# Patient Record
Sex: Female | Born: 1950 | Race: White | Hispanic: No | Marital: Married | Smoking: Never smoker
Health system: Southern US, Community
[De-identification: ages and names within clinical notes are randomized; demographics above are authoritative.]

---

## 2014-06-23 ENCOUNTER — Encounter (HOSPITAL_COMMUNITY): Payer: Self-pay | Admitting: Emergency Medicine

## 2014-06-23 ENCOUNTER — Emergency Department (HOSPITAL_COMMUNITY)
Admission: EM | Admit: 2014-06-23 | Discharge: 2014-06-23 | Disposition: A | Discharge status: Home / Self Care | Payer: BC Managed Care – PPO | Attending: Emergency Medicine | Admitting: Emergency Medicine

## 2014-06-23 DIAGNOSIS — S199XXA Unspecified injury of neck, initial encounter: Secondary | ICD-10-CM

## 2014-06-23 DIAGNOSIS — Y9389 Activity, other specified: Secondary | ICD-10-CM | POA: Insufficient documentation

## 2014-06-23 DIAGNOSIS — Z792 Long term (current) use of antibiotics: Secondary | ICD-10-CM | POA: Insufficient documentation

## 2014-06-23 DIAGNOSIS — Y9289 Other specified places as the place of occurrence of the external cause: Secondary | ICD-10-CM | POA: Diagnosis not present

## 2014-06-23 DIAGNOSIS — S79919A Unspecified injury of unspecified hip, initial encounter: Secondary | ICD-10-CM | POA: Diagnosis not present

## 2014-06-23 DIAGNOSIS — S01502A Unspecified open wound of oral cavity, initial encounter: Secondary | ICD-10-CM | POA: Diagnosis not present

## 2014-06-23 DIAGNOSIS — Z79899 Other long term (current) drug therapy: Secondary | ICD-10-CM | POA: Diagnosis not present

## 2014-06-23 DIAGNOSIS — W1809XA Striking against other object with subsequent fall, initial encounter: Secondary | ICD-10-CM | POA: Insufficient documentation

## 2014-06-23 DIAGNOSIS — S0181XA Laceration without foreign body of other part of head, initial encounter: Secondary | ICD-10-CM

## 2014-06-23 DIAGNOSIS — S0993XA Unspecified injury of face, initial encounter: Secondary | ICD-10-CM | POA: Insufficient documentation

## 2014-06-23 DIAGNOSIS — S0180XA Unspecified open wound of other part of head, initial encounter: Secondary | ICD-10-CM | POA: Diagnosis not present

## 2014-06-23 DIAGNOSIS — S79929A Unspecified injury of unspecified thigh, initial encounter: Secondary | ICD-10-CM

## 2014-06-23 DIAGNOSIS — S01512A Laceration without foreign body of oral cavity, initial encounter: Secondary | ICD-10-CM

## 2014-06-23 MED ORDER — PENICILLIN V POTASSIUM 500 MG PO TABS: 500.0000 mg | ORAL_TABLET | Freq: Four times a day (QID) | ORAL | Status: DC

## 2014-06-23 MED ORDER — HYDROCODONE-ACETAMINOPHEN 5-325 MG PO TABS: 2.0000 | ORAL_TABLET | ORAL | Status: DC | PRN

## 2014-06-23 NOTE — ED Provider Notes (Addendum)
CSN: [id number]     Arrival date & time 06/23/14  1014 History   First MD Initiated Contact with Patient 06/23/14 1104     Chief Complaint  Patient presents with  . Fall  . Facial Laceration  . Mouth Injury  . Hip Pain     HPI  Is presents after a fall in the shower. She fell forward and struck her face against the edge of the shower. She also struck her left leg in the mid thigh. His laceration in her mouth. Has laceration above her right eye. No loss of consciousness. States she slipped. Was not symptomatically for her fall. No loss of consciousness or amnesia. Was spitting some blood for a short time and states that her bottom front teeth feel numb. No confusion or perseveration. Pain in her left leg but she is ambulatory. Is not anticoagulated. Does not take medications.  History reviewed. No pertinent past medical history. History reviewed. No pertinent past surgical history. History reviewed. No pertinent family history. History  Substance Use Topics  . Smoking status: Never Smoker   . Smokeless tobacco: Not on file  . Alcohol Use: No   OB History   Grav Para Term Preterm Abortions TAB SAB Ect Mult Living                 Review of Systems  Constitutional: Negative for fever, chills, diaphoresis, appetite change and fatigue.  HENT: Positive for dental problem. Negative for mouth sores, sore throat and trouble swallowing.        Laceration in the mouth. Also above the right eye.  Eyes: Negative for visual disturbance.  Respiratory: Negative for cough, chest tightness, shortness of breath and wheezing.   Cardiovascular: Negative for chest pain.  Gastrointestinal: Negative for nausea, vomiting, abdominal pain, diarrhea and abdominal distention.  Endocrine: Negative for polydipsia, polyphagia and polyuria.  Genitourinary: Negative for dysuria, frequency and hematuria.  Musculoskeletal: Negative for gait problem.  Skin: Positive for wound. Negative for color change, pallor and  rash.  Neurological: Negative for dizziness, syncope, light-headedness and headaches.  Hematological: Does not bruise/bleed easily.  Psychiatric/Behavioral: Negative for behavioral problems and confusion.      Allergies  Review of patient's allergies indicates not on file.  Home Medications   Prior to Admission medications   Medication Sig Start Date End Date Taking? Authorizing Provider  HYDROcodone-acetaminophen (NORCO/VICODIN) 5-325 MG per tablet Take 2 tablets by mouth every 4 (four) hours as needed. 06/23/14   Rolland PorterMark Wayne Brunker, MD  penicillin v potassium (VEETID) 500 MG tablet Take 1 tablet (500 mg total) by mouth 4 (four) times daily. 06/23/14   Rolland PorterMark Kennedy Brines, MD   BP 112/73  Pulse 71  Temp(Src) 98.2 F (36.8 C) (Oral)  Resp 20  SpO2 98% Physical Exam  Constitutional: She is oriented to person, place, and time. She appears well-developed and well-nourished. No distress.  HENT:  Head: Normocephalic.    Mouth/Throat:    No blood over the TMs mastoids or from ears nose or mouth.  Eyes: Conjunctivae are normal. Pupils are equal, round, and reactive to light. No scleral icterus.  Neck: Normal range of motion. Neck supple. No thyromegaly present.  Cardiovascular: Normal rate and regular rhythm.  Exam reveals no gallop and no friction rub.   No murmur heard. Pulmonary/Chest: Effort normal and breath sounds normal. No respiratory distress. She has no wheezes. She has no rales.    Abdominal: Soft. Bowel sounds are normal. She exhibits no distension. There is no  tenderness. There is no rebound.  Musculoskeletal: Normal range of motion.       Legs: Neurological: She is alert and oriented to person, place, and time.  Skin: Skin is warm and dry. No rash noted.  Psychiatric: She has a normal mood and affect. Her behavior is normal.    ED Course  Procedures (including critical care time) Labs Review Labs Reviewed - No data to display  Imaging Review No results found.   EKG  Interpretation None      MDM   Final diagnoses:  Facial laceration, initial encounter  Laceration of mouth, initial encounter    Laceration of the right eyebrow repaired. Laceration between the gingiva and buccal mucosa anterior to the bottom teeth is not repaired. She will swish and spit after by mouth. Penicillinas  wound prophylaxis. No bite diet for the next several days.    Rolland Porter, MD 06/23/14 1203  Rolland Porter, MD 06/23/14 1205  Rolland Porter, MD 07/08/14 [phone]

## 2014-06-23 NOTE — ED Notes (Signed)
Pt. Stated, i slipped and fell in the bath tub and fell forward. I cut my head , mouth .  I fell forward and bruised my stomach and my left hip, leg area hurts.

## 2014-06-23 NOTE — Discharge Instructions (Signed)
Sutures should be removed between 8-7 and 8-12. Swish and spit water after meals. Numbness to the teeth can be temporary or permanent. Also, can take months to resolve.  Facial Laceration  A facial laceration is a cut on the face. These injuries can be painful and cause bleeding. Lacerations usually heal quickly, but they need special care to reduce scarring. DIAGNOSIS  Your health care provider will take a medical history, ask for details about how the injury occurred, and examine the wound to determine how deep the cut is. TREATMENT  Some facial lacerations may not require closure. Others may not be able to be closed because of an increased risk of infection. The risk of infection and the chance for successful closure will depend on various factors, including the amount of time since the injury occurred. The wound may be cleaned to help prevent infection. If closure is appropriate, pain medicines may be given if needed. Your health care provider will use stitches (sutures), wound glue (adhesive), or skin adhesive strips to repair the laceration. These tools bring the skin edges together to allow for faster healing and a better cosmetic outcome. If needed, you may also be given a tetanus shot. HOME CARE INSTRUCTIONS  Only take over-the-counter or prescription medicines as directed by your health care provider.  Follow your health care provider's instructions for wound care. These instructions will vary depending on the technique used for closing the wound. For Sutures:  Keep the wound clean and dry.   If you were given a bandage (dressing), you should change it at least once a day. Also change the dressing if it becomes wet or dirty, or as directed by your health care provider.   Wash the wound with soap and water 2 times a day. Rinse the wound off with water to remove all soap. Pat the wound dry with a clean towel.   After cleaning, apply a thin layer of the antibiotic ointment  recommended by your health care provider. This will help prevent infection and keep the dressing from sticking.   You may shower as usual after the first 24 hours. Do not soak the wound in water until the sutures are removed.   Get your sutures removed as directed by your health care provider. With facial lacerations, sutures should usually be taken out after 4-5 days to avoid stitch marks.   Wait a few days after your sutures are removed before applying any makeup. For Skin Adhesive Strips:  Keep the wound clean and dry.   Do not get the skin adhesive strips wet. You may bathe carefully, using caution to keep the wound dry.   If the wound gets wet, pat it dry with a clean towel.   Skin adhesive strips will fall off on their own. You may trim the strips as the wound heals. Do not remove skin adhesive strips that are still stuck to the wound. They will fall off in time.  For Wound Adhesive:  You may briefly wet your wound in the shower or bath. Do not soak or scrub the wound. Do not swim. Avoid periods of heavy sweating until the skin adhesive has fallen off on its own. After showering or bathing, gently pat the wound dry with a clean towel.   Do not apply liquid medicine, cream medicine, ointment medicine, or makeup to your wound while the skin adhesive is in place. This may loosen the film before your wound is healed.   If a dressing is placed over  the wound, be careful not to apply tape directly over the skin adhesive. This may cause the adhesive to be pulled off before the wound is healed.   Avoid prolonged exposure to sunlight or tanning lamps while the skin adhesive is in place.  The skin adhesive will usually remain in place for 5-10 days, then naturally fall off the skin. Do not pick at the adhesive film.  After Healing: Once the wound has healed, cover the wound with sunscreen during the day for 1 full year. This can help minimize scarring. Exposure to ultraviolet light  in the first year will darken the scar. It can take 1-2 years for the scar to lose its redness and to heal completely.  SEEK IMMEDIATE MEDICAL CARE IF:  You have redness, pain, or swelling around the wound.   You see ayellowish-white fluid (pus) coming from the wound.   You have chills or a fever.  MAKE SURE YOU:  Understand these instructions.  Will watch your condition.  Will get help right away if you are not doing well or get worse. Document Released: 12/16/2004 Document Revised: 08/29/2013 Document Reviewed: 06/21/2013 [facility] Patient Information 2015 [place], [place]. This information is not intended to replace advice given to you by your health care provider. Make sure you discuss any questions you have with your health care provider.

## 2019-06-19 ENCOUNTER — Ambulatory Visit: Payer: Managed Care, Other (non HMO)

## 2019-06-19 ENCOUNTER — Ambulatory Visit
Admission: EM | Admit: 2019-06-19 | Discharge: 2019-06-19 | Disposition: A | Discharge status: Home / Self Care | Payer: Managed Care, Other (non HMO) | Attending: Emergency Medicine | Admitting: Emergency Medicine

## 2019-06-19 ENCOUNTER — Other Ambulatory Visit: Payer: Self-pay

## 2019-06-19 ENCOUNTER — Ambulatory Visit (INDEPENDENT_AMBULATORY_CARE_PROVIDER_SITE_OTHER): Payer: Managed Care, Other (non HMO)

## 2019-06-19 ENCOUNTER — Encounter: Payer: Self-pay | Admitting: Emergency Medicine

## 2019-06-19 DIAGNOSIS — S92152A Displaced avulsion fracture (chip fracture) of left talus, initial encounter for closed fracture: Secondary | ICD-10-CM

## 2019-06-19 DIAGNOSIS — S93102A Unspecified subluxation of left toe(s), initial encounter: Secondary | ICD-10-CM | POA: Diagnosis not present

## 2019-06-19 DIAGNOSIS — W010XXA Fall on same level from slipping, tripping and stumbling without subsequent striking against object, initial encounter: Secondary | ICD-10-CM | POA: Diagnosis not present

## 2019-06-19 MED ORDER — NAPROXEN 500 MG PO TABS: 500.0000 mg | ORAL_TABLET | Freq: Two times a day (BID) | ORAL | 0 refills | Status: AC

## 2019-06-19 NOTE — ED Provider Notes (Addendum)
[name]    CSN: [id number] Arrival date & time: 06/19/19  1014     History   Chief Complaint Chief Complaint  Patient presents with   Foot Pain    HPI Stacey Chan is a 68 y.o. female presenting for left second toe pain and swelling.  Patient states that she slipped and fell on a wet floor at a fast food restaurant last night around 17:00.  Denies head trauma, loss of consciousness.  No other pain reported.  Patient denies easy bruising, bleeding, anticoagulation or blood thinner use.    History reviewed. No pertinent past medical history.  There are no active problems to display for this patient.   History reviewed. No pertinent surgical history.  OB History   No obstetric history on file.      Home Medications    Prior to Admission medications   Medication Sig Start Date End Date Taking? Authorizing Provider  naproxen (NAPROSYN) 500 MG tablet Take 1 tablet (500 mg total) by mouth 2 (two) times daily. 06/19/19   Hall-Potvin, [place], PA-C    Family History History reviewed. No pertinent family history.  Social History Social History   Tobacco Use   Smoking status: Never Smoker   Smokeless tobacco: Never Used  Substance Use Topics   Alcohol use: No   Drug use: No     Allergies   Patient has no known allergies.   Review of Systems Review of Systems  Constitutional: Negative for fatigue and fever.  HENT: Negative for ear pain, sinus pain, sore throat and voice change.   Eyes: Negative for pain, redness and visual disturbance.  Respiratory: Negative for cough and shortness of breath.   Cardiovascular: Negative for chest pain and palpitations.  Gastrointestinal: Negative for abdominal pain, diarrhea and vomiting.  Musculoskeletal: Negative for arthralgias and myalgias.       Positive toe pain and deformity  Skin: Negative for rash and wound.  Neurological: Negative for syncope and headaches.     Physical Exam Triage Vital  Signs ED Triage Vitals  Enc Vitals Group     BP      Pulse      Resp      Temp      Temp src      SpO2      Weight      Height      Head Circumference      Peak Flow      Pain Score      Pain Loc      Pain Edu?      Excl. in [place]?    No data found.  Updated Vital Signs BP 137/74 (BP Location: Right Arm)    Pulse 74    Temp 98.1 F (36.7 C) (Oral)    Resp 16    SpO2 98%   Visual Acuity Right Eye Distance:   Left Eye Distance:   Bilateral Distance:    Right Eye Near:   Left Eye Near:    Bilateral Near:     Physical Exam Constitutional:      General: She is not in acute distress. HENT:     Head: Normocephalic and atraumatic.  Eyes:     General: No scleral icterus.    Pupils: Pupils are equal, round, and reactive to light.  Cardiovascular:     Rate and Rhythm: Normal rate.  Pulmonary:     Effort: Pulmonary effort is normal.  Musculoskeletal:     Comments: Bony  deformity of distal second toe that is tender and mildly swollen.  Decreased range of motion second pain.  NVI.  Patient is able to ambulate, though is antalgic, favoring affected foot  Skin:    Capillary Refill: Capillary refill takes less than 2 seconds.     Coloration: Skin is not jaundiced or pale.  Neurological:     General: No focal deficit present.     Mental Status: She is alert and oriented to person, place, and time.     Deep Tendon Reflexes: Reflexes normal.  Psychiatric:        Mood and Affect: Mood normal.        Thought Content: Thought content normal.      UC Treatments / Results  Labs (all labs ordered are listed, but only abnormal results are displayed) Labs Reviewed - No data to display  EKG   Radiology Dg Foot Complete Left  Result Date: 06/19/2019 CLINICAL DATA:  Fall.  Foot swelling and pain. EXAM: LEFT FOOT - COMPLETE 3+ VIEW COMPARISON:  None. FINDINGS: Subluxation of the second PIP joint without definite fracture Avulsion fracture of the dorsal talus distally which  appears acute. IMPRESSION: Subluxation second PIP joint Avulsion fracture dorsal talus Electronically Signed   By: Marlan Palauharles  Clark M.D.   On: 06/19/2019 11:11   Dg Toe 2nd Left  Result Date: 06/19/2019 CLINICAL DATA:  Post reduction. EXAM: LEFT SECOND TOE COMPARISON:  05/20/2019. FINDINGS: Persistent subluxation left second proximal interphalangeal joint. No other focal abnormality noted about the left second digit. IMPRESSION: Persistent subluxation left proximal interphalangeal joint. Electronically Signed   By: Maisie Fushomas  Register   On: 06/19/2019 12:11    Procedures Orthopedic Injury Treatment  Date/Time: 06/19/2019 2:46 PM Performed by: Shea EvansHall-Potvin, Haley Fuerstenberg, PA-C Authorized by: Shea EvansHall-Potvin, Hartley Wyke, PA-C   Consent:    Consent obtained:  Verbal   Consent given by:  Patient   Risks discussed:  Irreducible dislocation, recurrent dislocation, nerve damage, restricted joint movement, vascular damage and stiffness   Alternatives discussed:  Immobilization, referral and delayed treatmentInjury location: toe Location details: left second toe Injury type: dislocation Dislocation type: PIP Pre-procedure neurovascular assessment: neurovascularly intact Pre-procedure distal perfusion: normal Pre-procedure neurological function: normal Pre-procedure range of motion: reduced Anesthesia: digital block  Anesthesia: Local anesthesia used: yes Local Anesthetic: lidocaine 2% without epinephrine Anesthetic total: 3 mL  Patient sedated: NoManipulation performed: yes Reduction successful: no X-ray confirmed reduction: repeat lateral showing persistent PIP subluxation. Immobilization: post-op boot. Post-procedure neurovascular assessment: post-procedure neurovascularly intact Post-procedure distal perfusion: normal Post-procedure neurological function: normal Post-procedure range of motion: improved Patient tolerance: patient tolerated the procedure well with no immediate complications     (including critical care time)  Medications Ordered in UC Medications - No data to display  Initial Impression / Assessment and Plan / UC Course  I have reviewed the triage vital signs and the nursing notes.  Pertinent labs & imaging results that were available during my care of the patient were reviewed by me and considered in my medical decision making (see chart for details).     1.  Subluxation of left toe and avulsion fracture of left talus Left foot x-ray done in office, reviewed by myself and radiology: Positive for second digit PIP subluxation without fracture and a avulsion fraction of the dorsal talus distally.  Manual reduction of left second digit PIP attempted which patient tolerated well, that was unsuccessful.  Earney HamburgMichael Jeffrey, PA-C from Ortho was consulted: Recommended partial shoe with follow-up within 1 week.  Provided patient with postoperative boot and follow-up recommendations.  Will manage pain conservatively as listed below.  Return precautions discussed, patient verbalized understanding and is agreeable to plan.  Final Clinical Impressions(s) / UC Diagnoses   Final diagnoses:  Subluxation of left toe, initial encounter  Avulsion fracture of left talus, closed, initial encounter     Discharge Instructions     Wear boot daily, rest, ice, elevate throughout the day. Follow-up with Ortho as listed above within 1 week. Take 500 mg naproxen twice daily as needed; may add Tylenol. Return for worsening pain, redness, heat, inability to walk.    ED Prescriptions    Medication Sig Dispense Auth. Provider   naproxen (NAPROSYN) 500 MG tablet Take 1 tablet (500 mg total) by mouth 2 (two) times daily. 30 tablet Hall-Potvin, [place], PA-C     Controlled Substance Prescriptions [place] Controlled Substance Registry consulted? Not Applicable   Shea EvansHall-Potvin, Modelle Vollmer, PA-C 06/19/19 1450    Hall-Potvin, [place]itt[place], [place] JerseyPA-C 06/19/19 1458

## 2019-06-19 NOTE — Discharge Instructions (Addendum)
Wear boot daily, rest, ice, elevate throughout the day. Follow-up with Ortho as listed above within 1 week. Take 500 mg naproxen twice daily as needed; may add Tylenol. Return for worsening pain, redness, heat, inability to walk.

## 2019-06-19 NOTE — ED Triage Notes (Signed)
Pt presents to [facility] for assessment of left foot pain after she slipped on a wet floor.  Unsure of head injury, denies LOC.

## 2019-06-26 ENCOUNTER — Telehealth: Payer: Self-pay | Admitting: Emergency Medicine

## 2019-06-26 NOTE — Telephone Encounter (Signed)
Patient called stating she attempted to call the ortho office listed on her d/c paperwork without success in scheduling a follow up appointment for her dislocated toe.  APP on site (Amy) called on call ortho for today, spoke with them about patient, and patient in queue to make an appointment.  Ortho office states they will review notes and call patient.

## 2019-07-02 ENCOUNTER — Telehealth: Payer: Self-pay

## 2021-02-19 DIAGNOSIS — H6122 Impacted cerumen, left ear: Secondary | ICD-10-CM | POA: Diagnosis not present

## 2021-02-19 DIAGNOSIS — H9202 Otalgia, left ear: Secondary | ICD-10-CM | POA: Diagnosis not present

## 2021-02-27 DIAGNOSIS — I4891 Unspecified atrial fibrillation: Secondary | ICD-10-CM | POA: Diagnosis not present

## 2021-02-27 DIAGNOSIS — I2510 Atherosclerotic heart disease of native coronary artery without angina pectoris: Secondary | ICD-10-CM | POA: Diagnosis not present

## 2021-02-27 DIAGNOSIS — I420 Dilated cardiomyopathy: Secondary | ICD-10-CM | POA: Diagnosis not present

## 2021-02-27 DIAGNOSIS — E785 Hyperlipidemia, unspecified: Secondary | ICD-10-CM | POA: Diagnosis not present

## 2021-03-16 ENCOUNTER — Ambulatory Visit
Admission: EM | Admit: 2021-03-16 | Discharge: 2021-03-16 | Disposition: A | Discharge status: Home / Self Care | Payer: BC Managed Care – PPO | Attending: Emergency Medicine | Admitting: Emergency Medicine

## 2021-03-16 ENCOUNTER — Encounter: Payer: Self-pay | Admitting: Emergency Medicine

## 2021-03-16 ENCOUNTER — Other Ambulatory Visit: Payer: Self-pay

## 2021-03-16 DIAGNOSIS — J019 Acute sinusitis, unspecified: Secondary | ICD-10-CM

## 2021-03-16 DIAGNOSIS — J22 Unspecified acute lower respiratory infection: Secondary | ICD-10-CM

## 2021-03-16 MED ORDER — ALBUTEROL SULFATE HFA 108 (90 BASE) MCG/ACT IN AERS
2.0000 | INHALATION_SPRAY | Freq: Once | RESPIRATORY_TRACT | Status: AC
  Administered 2021-03-16: 2 via RESPIRATORY_TRACT

## 2021-03-16 MED ORDER — BENZONATATE 200 MG PO CAPS: 200.0000 mg | ORAL_CAPSULE | Freq: Three times a day (TID) | ORAL | 0 refills | Status: AC | PRN

## 2021-03-16 MED ORDER — PREDNISONE 50 MG PO TABS
50.0000 mg | ORAL_TABLET | Freq: Every day | ORAL | 0 refills | Status: AC
Start: 2021-03-16 — End: 2021-03-21

## 2021-03-16 MED ORDER — DM-GUAIFENESIN ER 30-600 MG PO TB12: 1.0000 | ORAL_TABLET | Freq: Two times a day (BID) | ORAL | 0 refills | Status: AC

## 2021-03-16 MED ORDER — AZITHROMYCIN 250 MG PO TABS: 250.0000 mg | ORAL_TABLET | Freq: Every day | ORAL | 0 refills | Status: DC

## 2021-03-16 MED ORDER — AMOXICILLIN-POT CLAVULANATE 875-125 MG PO TABS: 1.0000 | ORAL_TABLET | Freq: Two times a day (BID) | ORAL | 0 refills | Status: AC

## 2021-03-16 MED ORDER — DEXAMETHASONE 10 MG/ML FOR PEDIATRIC ORAL USE
10.0000 mg | Freq: Once | INTRAMUSCULAR | Status: AC
Start: 2021-03-16 — End: 2021-03-16
  Administered 2021-03-16: 10 mg via ORAL

## 2021-03-16 NOTE — Discharge Instructions (Addendum)
We gave you 1 dose of Decadron here, continue with prednisone daily for 5 days Albuterol inhaler 1 to 2 puffs every 4-6 hours as needed for shortness of breath, chest tightness, wheezing Begin Augmentin twice daily x1 week Azithromycin-2 tablets today, 1 tablet for the following 4 days Mucinex DM twice daily for further relief of congestion/cough Tessalon every 8 hours as needed for cough Rest and fluids Follow-up if not improving or worsening

## 2021-03-16 NOTE — ED Provider Notes (Signed)
[name]    CSN: [id number] Arrival date & time: 03/16/21  1032      History   Chief Complaint Chief Complaint  Patient presents with  . Facial Pain  . Nasal Congestion  . Laryngitis  . Cough    HPI Stacey Chan is a 70 y.o. female presenting today for evaluation of URI symptoms.  Reports cough congestion sinus pain pressure and some shortness of breath for approximately 1 week.  Feel symptoms have moved to lungs.  Using Tylenol without relief.  Denies known fevers.  HPI  History reviewed. No pertinent past medical history.  There are no problems to display for this patient.   History reviewed. No pertinent surgical history.  OB History   No obstetric history on file.      Home Medications    Prior to Admission medications   Medication Sig Start Date End Date Taking? Authorizing Provider  amoxicillin-clavulanate (AUGMENTIN) 875-125 MG tablet Take 1 tablet by mouth every 12 (twelve) hours for 7 days. 03/16/21 03/23/21 Yes Shakina Choy C, PA-C  azithromycin (ZITHROMAX) 250 MG tablet Take 1 tablet (250 mg total) by mouth daily. Take first 2 tablets together, then 1 every day until finished. 03/16/21  Yes Rossi Silvestro C, PA-C  benzonatate (TESSALON) 200 MG capsule Take 1 capsule (200 mg total) by mouth 3 (three) times daily as needed for up to 7 days for cough. 03/16/21 03/23/21 Yes Mayme Profeta C, PA-C  dextromethorphan-guaiFENesin (MUCINEX DM) 30-600 MG 12hr tablet Take 1 tablet by mouth 2 (two) times daily. 03/16/21  Yes Sylvain Hasten C, PA-C  predniSONE (DELTASONE) 50 MG tablet Take 1 tablet (50 mg total) by mouth daily with breakfast for 5 days. 03/16/21 03/21/21 Yes Hunter Bachar C, PA-C  naproxen (NAPROSYN) 500 MG tablet Take 1 tablet (500 mg total) by mouth 2 (two) times daily. 06/19/19   Hall-Potvin, [place], PA-C    Family History History reviewed. No pertinent family history.  Social History Social History   Tobacco Use  . Smoking  status: Never Smoker  . Smokeless tobacco: Never Used  Substance Use Topics  . Alcohol use: No  . Drug use: No     Allergies   Patient has no known allergies.   Review of Systems Review of Systems  Constitutional: Negative for activity change, appetite change, chills, fatigue and fever.  HENT: Positive for congestion, rhinorrhea, sinus pressure and sore throat. Negative for ear pain and trouble swallowing.   Eyes: Negative for discharge and redness.  Respiratory: Positive for cough, shortness of breath and wheezing. Negative for chest tightness.   Cardiovascular: Negative for chest pain.  Gastrointestinal: Negative for abdominal pain, diarrhea, nausea and vomiting.  Musculoskeletal: Negative for myalgias.  Skin: Negative for rash.  Neurological: Negative for dizziness, light-headedness and headaches.     Physical Exam Triage Vital Signs ED Triage Vitals  Enc Vitals Group     BP      Pulse      Resp      Temp      Temp src      SpO2      Weight      Height      Head Circumference      Peak Flow      Pain Score      Pain Loc      Pain Edu?      Excl. in GC?    No data found.  Updated Vital Signs BP 132/82 (BP Location:  Left Arm)   Pulse 79   Temp 98.7 F (37.1 C) (Oral)   Resp 18   SpO2 95%   Visual Acuity Right Eye Distance:   Left Eye Distance:   Bilateral Distance:    Right Eye Near:   Left Eye Near:    Bilateral Near:     Physical Exam Vitals and nursing note reviewed.  Constitutional:      Appearance: She is well-developed.     Comments: No acute distress  HENT:     Head: Normocephalic and atraumatic.     Ears:     Comments: Bilateral ears without tenderness to palpation of external auricle, tragus and mastoid, EAC's without erythema or swelling, TM's with good bony landmarks and cone of light. Non erythematous.     Nose: Nose normal.     Mouth/Throat:     Comments: Oral mucosa pink and moist, no tonsillar enlargement or exudate.  Posterior pharynx patent and nonerythematous, no uvula deviation or swelling. Normal phonation. Eyes:     Conjunctiva/sclera: Conjunctivae normal.  Cardiovascular:     Rate and Rhythm: Normal rate and regular rhythm.  Pulmonary:     Effort: Pulmonary effort is normal. No respiratory distress.     Breath sounds: Wheezing present.     Comments: Breathing comfortably at rest, inspiratory and expiratory wheezing noted throughout all lung fields Abdominal:     General: There is no distension.  Musculoskeletal:        General: Normal range of motion.     Cervical back: Neck supple.  Skin:    General: Skin is warm and dry.  Neurological:     Mental Status: She is alert and oriented to person, place, and time.      UC Treatments / Results  Labs (all labs ordered are listed, but only abnormal results are displayed) Labs Reviewed - No data to display  EKG   Radiology No results found.  Procedures Procedures (including critical care time)  Medications Ordered in UC Medications  albuterol (VENTOLIN HFA) 108 (90 Base) MCG/ACT inhaler 2 puff (2 puffs Inhalation Given 03/16/21 1207)  dexamethasone (DECADRON) 10 MG/ML injection for Pediatric ORAL use 10 mg (10 mg Oral Given 03/16/21 1207)    Initial Impression / Assessment and Plan / UC Course  I have reviewed the triage vital signs and the nursing notes.  Pertinent labs & imaging results that were available during my care of the patient were reviewed by me and considered in my medical decision making (see chart for details).    Treating for sinusitis/bronchitis-symptoms x1 week, covering with Augmentin and azithromycin, albuterol inhaler and prednisone course.  Mucinex and Tessalon for further symptomatic relief of cough and congestion.  Push fluids.  Discussed strict return precautions. Patient verbalized understanding and is agreeable with plan.  Final Clinical Impressions(s) / UC Diagnoses   Final diagnoses:  Acute sinusitis  with symptoms > 10 days  Lower respiratory infection (e.g., bronchitis, pneumonia, pneumonitis, pulmonitis)     Discharge Instructions     We gave you 1 dose of Decadron here, continue with prednisone daily for 5 days Albuterol inhaler 1 to 2 puffs every 4-6 hours as needed for shortness of breath, chest tightness, wheezing Begin Augmentin twice daily x1 week Azithromycin-2 tablets today, 1 tablet for the following 4 days Mucinex DM twice daily for further relief of congestion/cough Tessalon every 8 hours as needed for cough Rest and fluids Follow-up if not improving or worsening     ED Prescriptions  Medication Sig Dispense Auth. Provider   amoxicillin-clavulanate (AUGMENTIN) 875-125 MG tablet Take 1 tablet by mouth every 12 (twelve) hours for 7 days. 14 tablet Raegyn Renda C, PA-C   azithromycin (ZITHROMAX) 250 MG tablet Take 1 tablet (250 mg total) by mouth daily. Take first 2 tablets together, then 1 every day until finished. 6 tablet Berit Raczkowski C, PA-C   predniSONE (DELTASONE) 50 MG tablet Take 1 tablet (50 mg total) by mouth daily with breakfast for 5 days. 5 tablet Raphel Stickles C, PA-C   benzonatate (TESSALON) 200 MG capsule Take 1 capsule (200 mg total) by mouth 3 (three) times daily as needed for up to 7 days for cough. 28 capsule Laronda Lisby C, PA-C   dextromethorphan-guaiFENesin (MUCINEX DM) 30-600 MG 12hr tablet Take 1 tablet by mouth 2 (two) times daily. 20 tablet Reginal Wojcicki, [place] C, PA-C     PDMP not reviewed this encounter.   Lew Dawes, PA-C 03/16/21 1214

## 2021-03-16 NOTE — ED Triage Notes (Signed)
Pt presents with cough, nasal congestion, sinus pain and pressure, and loss of voice xs 1 week. States tylenol gives no relief.

## 2021-12-03 ENCOUNTER — Ambulatory Visit
Admission: RE | Admit: 2021-12-03 | Discharge: 2021-12-03 | Disposition: A | Discharge status: Home / Self Care | Payer: Managed Care, Other (non HMO) | Source: Ambulatory Visit | Attending: Physician Assistant | Admitting: Physician Assistant

## 2021-12-03 ENCOUNTER — Ambulatory Visit (INDEPENDENT_AMBULATORY_CARE_PROVIDER_SITE_OTHER): Payer: Managed Care, Other (non HMO)

## 2021-12-03 ENCOUNTER — Other Ambulatory Visit: Payer: Self-pay

## 2021-12-03 VITALS — BP 145/91 | HR 65 | Temp 98.1°F | Resp 16

## 2021-12-03 DIAGNOSIS — J189 Pneumonia, unspecified organism: Secondary | ICD-10-CM

## 2021-12-03 DIAGNOSIS — R059 Cough, unspecified: Secondary | ICD-10-CM

## 2021-12-03 DIAGNOSIS — J441 Chronic obstructive pulmonary disease with (acute) exacerbation: Secondary | ICD-10-CM | POA: Diagnosis not present

## 2021-12-03 MED ORDER — DOXYCYCLINE HYCLATE 100 MG PO CAPS: 100.0000 mg | ORAL_CAPSULE | Freq: Two times a day (BID) | ORAL | 0 refills | Status: DC

## 2021-12-03 MED ORDER — PREDNISONE 20 MG PO TABS: 40.0000 mg | ORAL_TABLET | Freq: Every day | ORAL | 0 refills | Status: AC

## 2021-12-03 NOTE — ED Triage Notes (Signed)
Cough for around a month, getting progressively worse

## 2021-12-03 NOTE — ED Provider Notes (Signed)
[name]    CSN: [id number] Arrival date & time: 12/03/21  1638      History   Chief Complaint Chief Complaint  Patient presents with   Cough    HPI Stacey Chan is a 71 y.o. female.   Patient here today for evaluation of cough that she has had for about a month.  She states that cough seems to be worsening with time.  She has been notified in the past that she has COPD and is not sure if this is related to symptoms.  She did have fever last week but nothing that is been persistent.She reports she has tried to take shallow breaths to prevent coughing and this has seemed to be helpful.   The history is provided by the patient.   History reviewed. No pertinent past medical history.  There are no problems to display for this patient.   History reviewed. No pertinent surgical history.  OB History   No obstetric history on file.      Home Medications    Prior to Admission medications   Medication Sig Start Date End Date Taking? Authorizing Provider  doxycycline (VIBRAMYCIN) 100 MG capsule Take 1 capsule (100 mg total) by mouth 2 (two) times daily. 12/03/21  Yes Tomi Bamberger, PA-C  predniSONE (DELTASONE) 20 MG tablet Take 2 tablets (40 mg total) by mouth daily with breakfast for 5 days. 12/03/21 12/08/21 Yes Tomi Bamberger, PA-C  azithromycin (ZITHROMAX) 250 MG tablet Take 1 tablet (250 mg total) by mouth daily. Take first 2 tablets together, then 1 every day until finished. 03/16/21   Wieters, Hallie C, PA-C  dextromethorphan-guaiFENesin (MUCINEX DM) 30-600 MG 12hr tablet Take 1 tablet by mouth 2 (two) times daily. 03/16/21   Wieters, Hallie C, PA-C  naproxen (NAPROSYN) 500 MG tablet Take 1 tablet (500 mg total) by mouth 2 (two) times daily. 06/19/19   Hall-Potvin, [place], PA-C    Family History History reviewed. No pertinent family history.  Social History Social History   Tobacco Use   Smoking status: Never   Smokeless tobacco: Never  Substance  Use Topics   Alcohol use: No   Drug use: No     Allergies   Patient has no known allergies.   Review of Systems Review of Systems  Constitutional:  Negative for chills and fever.  HENT:  Positive for congestion. Negative for ear pain.   Eyes:  Negative for discharge and redness.  Respiratory:  Positive for cough and shortness of breath.   Gastrointestinal:  Negative for abdominal pain, diarrhea, nausea and vomiting.    Physical Exam Triage Vital Signs ED Triage Vitals  Enc Vitals Group     BP      Pulse      Resp      Temp      Temp src      SpO2      Weight      Height      Head Circumference      Peak Flow      Pain Score      Pain Loc      Pain Edu?      Excl. in GC?    No data found.  Updated Vital Signs BP (!) 145/91 (BP Location: Left Arm)    Pulse 65    Temp 98.1 F (36.7 C) (Oral)    Resp 16    SpO2 96%      Physical Exam Vitals and  nursing note reviewed.  Constitutional:      General: She is not in acute distress.    Appearance: Normal appearance. She is not ill-appearing.  HENT:     Head: Normocephalic and atraumatic.     Nose: No congestion or rhinorrhea.     Mouth/Throat:     Mouth: Mucous membranes are moist.     Pharynx: No oropharyngeal exudate or posterior oropharyngeal erythema.  Eyes:     Conjunctiva/sclera: Conjunctivae normal.  Cardiovascular:     Rate and Rhythm: Normal rate and regular rhythm.     Heart sounds: Normal heart sounds. No murmur heard. Pulmonary:     Effort: Pulmonary effort is normal. No respiratory distress.     Breath sounds: Wheezing (mild diffuse wheezing) and rhonchi (scattered occasional) present. No rales.  Skin:    General: Skin is warm and dry.  Neurological:     Mental Status: She is alert.  Psychiatric:        Mood and Affect: Mood normal.        Thought Content: Thought content normal.     UC Treatments / Results  Labs (all labs ordered are listed, but only abnormal results are displayed) Labs  Reviewed - No data to display  EKG   Radiology DG Chest 2 View  Result Date: 12/03/2021 CLINICAL DATA:  Cough for about a month per, progressively worse EXAM: CHEST - 2 VIEW COMPARISON:  None FINDINGS: Normal heart size, mediastinal contours, and pulmonary vascularity. LEFT lower lobe retrocardiac opacity question atelectasis versus infiltrate. Remaining lungs clear. No pleural effusion or pneumothorax. Bones demineralized. Atherosclerotic calcification aorta. IMPRESSION: LEFT lower lobe atelectasis versus infiltrate. Aortic Atherosclerosis (ICD10-I70.0). Electronically Signed   By: Ulyses Southward M.D.   On: 12/03/2021 17:18    Procedures Procedures (including critical care time)  Medications Ordered in UC Medications - No data to display  Initial Impression / Assessment and Plan / UC Course  I have reviewed the triage vital signs and the nursing notes.  Pertinent labs & imaging results that were available during my care of the patient were reviewed by me and considered in my medical decision making (see chart for details).   Will treat to cover both COPD exacerbation as well as pneumonia given xray findings. Encouraged patient to take deep breaths and be mindful of this given possible atelectasis and the probability that shallow breathing may make overall course worse. Patient expresses understanding. Recommended follow up with any further concerns.   Final Clinical Impressions(s) / UC Diagnoses   Final diagnoses:  COPD exacerbation (HCC)  Pneumonia of left lower lobe due to infectious organism   Discharge Instructions   None    ED Prescriptions     Medication Sig Dispense Auth. Provider   doxycycline (VIBRAMYCIN) 100 MG capsule Take 1 capsule (100 mg total) by mouth 2 (two) times daily. 20 capsule Erma Pinto F, PA-C   predniSONE (DELTASONE) 20 MG tablet Take 2 tablets (40 mg total) by mouth daily with breakfast for 5 days. 10 tablet Tomi Bamberger, PA-C      PDMP not  reviewed this encounter.   Tomi Bamberger, PA-C 12/03/21 1730

## 2021-12-14 ENCOUNTER — Ambulatory Visit: Payer: Self-pay

## 2021-12-14 NOTE — Telephone Encounter (Signed)
°  Chief Complaint: low oxygen, worseing cough Symptoms: wheezing , O2 sat 89%, worsening cough, moderate SOB, increased yellow phlegm Frequency: cough began 6 weeks ago and worsened over the last few days. Pertinent Negatives: Patient denies chest pain Disposition: [] ED /[] Urgent Care (no appt availability in office) / [] Appointment(In office/virtual)/ []  [place] [facility]/ [] Home Care/ [x] Refused Recommended Disposition /[] [facility]/ []  Follow-up with PCP Additional Notes: advised pt to go to ED but pt refused disposition and stated she will go back to UC.          Reason for Disposition  [1] Increasing difficulty breathing AND [2] always has some difficulty breathing  Answer Assessment - Initial Assessment Questions 1. ONSET: "When did the cough begin?"      6 weeks ago 2. SEVERITY: "How bad is the cough today?"      More frequent 3. SPUTUM: "Describe the color of your sputum" (none, dry cough; clear, white, yellow, green)     Phlegm yellow 4. HEMOPTYSIS: "Are you coughing up any blood?" If so ask: "How much?" (flecks, streaks, tablespoons, etc.)     no 5. DIFFICULTY BREATHING: "Are you having difficulty breathing?" If Yes, ask: "How bad is it?" (e.g., mild, moderate, severe)    - MILD: No SOB at rest, mild SOB with walking, speaks normally in sentences, can lie down, no retractions, pulse < 100.    - MODERATE: SOB at rest, SOB with minimal exertion and prefers to sit, cannot lie down flat, speaks in phrases, mild retractions, audible wheezing, pulse 100-120.    - SEVERE: Very SOB at rest, speaks in single words, struggling to breathe, sitting hunched forward, retractions, pulse > 120      moderate 6. FEVER: "Do you have a fever?" If Yes, ask: "What is your temperature, how was it measured, and when did it start?"     no 7. CARDIAC HISTORY: "Do you have any history of heart disease?" (e.g., heart attack, congestive heart failure)      *No Answer* 8. LUNG  HISTORY: "Do you have any history of lung disease?"  (e.g., pulmonary embolus, asthma, emphysema)     COPD, pna 9. PE RISK FACTORS: "Do you have a history of blood clots?" (or: recent major surgery, recent prolonged travel, bedridden)     *No Answer* 10. OTHER SYMPTOMS: "Do you have any other symptoms?" (e.g., runny nose, wheezing, chest pain)       Wheezing and runny nose 11. PREGNANCY: "Is there any chance you are pregnant?" "When was your last menstrual period?"       *No Answer* 12. TRAVEL: "Have you traveled out of the country in the last month?" (e.g., travel history, exposures)       *No Answer*  Protocols used: Cough - Chronic-A-AH

## 2021-12-15 ENCOUNTER — Encounter: Payer: Self-pay | Admitting: Emergency Medicine

## 2021-12-15 ENCOUNTER — Other Ambulatory Visit: Payer: Self-pay

## 2021-12-15 ENCOUNTER — Ambulatory Visit
Admission: EM | Admit: 2021-12-15 | Discharge: 2021-12-15 | Disposition: A | Discharge status: Home / Self Care | Payer: Managed Care, Other (non HMO) | Attending: Internal Medicine | Admitting: Internal Medicine

## 2021-12-15 DIAGNOSIS — J189 Pneumonia, unspecified organism: Secondary | ICD-10-CM | POA: Diagnosis not present

## 2021-12-15 MED ORDER — AZITHROMYCIN 500 MG PO TABS: ORAL_TABLET | ORAL | 0 refills | Status: AC

## 2021-12-15 MED ORDER — AMOXICILLIN-POT CLAVULANATE 875-125 MG PO TABS: 1.0000 | ORAL_TABLET | Freq: Two times a day (BID) | ORAL | 0 refills | Status: DC

## 2021-12-15 MED ORDER — ALBUTEROL SULFATE HFA 108 (90 BASE) MCG/ACT IN AERS: 1.0000 | INHALATION_SPRAY | Freq: Four times a day (QID) | RESPIRATORY_TRACT | 0 refills | Status: AC | PRN

## 2021-12-15 MED ORDER — PREDNISONE 10 MG (21) PO TBPK: ORAL_TABLET | Freq: Every day | ORAL | 0 refills | Status: DC

## 2021-12-15 NOTE — ED Triage Notes (Signed)
Patient c/o cough no better, has had cough x 6 weeks.

## 2021-12-15 NOTE — ED Provider Notes (Signed)
[name]    CSN: [id number] Arrival date & time: 12/15/21  1418      History   Chief Complaint Chief Complaint  Patient presents with   Cough    HPI Stacey Chan is a 71 y.o. female.   Patient presents with persistent cough that has been present for approximately 6 weeks.  Patient was seen on 12/03/2021 for same symptoms.  Atelectasis in the left lobe was shown on chest x-ray at that time.  She was treated for possible COPD exacerbation with doxycycline and Medrol Dosepak with no improvement in symptoms.  She has had some associated shortness of breath since.  She is monitoring her oxygen at home and has been ranging 81 to 89% at times.  Reports that she bought some oxygen on [employer] and has been using this to help.  Denies any known fevers.  She reports that she was told in the past that she has COPD.   Cough  History reviewed. No pertinent past medical history.  There are no problems to display for this patient.   History reviewed. No pertinent surgical history.  OB History   No obstetric history on file.      Home Medications    Prior to Admission medications   Medication Sig Start Date End Date Taking? Authorizing Provider  albuterol (VENTOLIN HFA) 108 (90 Base) MCG/ACT inhaler Inhale 1-2 puffs into the lungs every 6 (six) hours as needed for wheezing or shortness of breath. 12/15/21  Yes Jihan Rudy, Rolly SalterHaley E, FNP  amoxicillin-clavulanate (AUGMENTIN) 875-125 MG tablet Take 1 tablet by mouth every 12 (twelve) hours. 12/15/21  Yes Jaysean Manville, Acie FredricksonHaley E, FNP  azithromycin (ZITHROMAX) 500 MG tablet Take 1 tablet (500 mg total) by mouth daily for 1 day, THEN 0.5 tablets (250 mg total) daily for 4 days. 12/15/21 12/20/21 Yes Dyer Klug, Acie FredricksonHaley E, FNP  predniSONE (STERAPRED UNI-PAK 21 TAB) 10 MG (21) TBPK tablet Take by mouth daily. Take 6 tabs by mouth daily  for 2 days, then 5 tabs for 2 days, then 4 tabs for 2 days, then 3 tabs for 2 days, 2 tabs for 2 days, then 1 tab by mouth  daily for 2 days 12/15/21  Yes Mical Kicklighter, [place] E, FNP  dextromethorphan-guaiFENesin (MUCINEX DM) 30-600 MG 12hr tablet Take 1 tablet by mouth 2 (two) times daily. 03/16/21   Wieters, Hallie C, PA-C  doxycycline (VIBRAMYCIN) 100 MG capsule Take 1 capsule (100 mg total) by mouth 2 (two) times daily. 12/03/21   Tomi BambergerMyers, Rebecca F, PA-C  naproxen (NAPROSYN) 500 MG tablet Take 1 tablet (500 mg total) by mouth 2 (two) times daily. 06/19/19   Hall-Potvin, [place], PA-C    Family History No family history on file.  Social History Social History   Tobacco Use   Smoking status: Never   Smokeless tobacco: Never  Substance Use Topics   Alcohol use: No   Drug use: No     Allergies   Patient has no known allergies.   Review of Systems Review of Systems Per HPI  Physical Exam Triage Vital Signs ED Triage Vitals [12/15/21 1438]  Enc Vitals Group     BP (!) 163/94     Pulse Rate (!) 102     Resp 18     Temp 97.9 F (36.6 C)     Temp Source Oral     SpO2 95 %     Weight      Height      Head Circumference  Peak Flow      Pain Score 0     Pain Loc      Pain Edu?      Excl. in GC?    No data found.  Updated Vital Signs BP (!) 163/94 (BP Location: Left Arm)    Pulse (!) 102    Temp 97.9 F (36.6 C) (Oral)    Resp 18    SpO2 95%   Visual Acuity Right Eye Distance:   Left Eye Distance:   Bilateral Distance:    Right Eye Near:   Left Eye Near:    Bilateral Near:     Physical Exam Constitutional:      General: She is not in acute distress.    Appearance: Normal appearance. She is not toxic-appearing or diaphoretic.  HENT:     Head: Normocephalic and atraumatic.     Right Ear: Tympanic membrane and ear canal normal.     Left Ear: Tympanic membrane and ear canal normal.     Nose: No congestion.     Mouth/Throat:     Mouth: Mucous membranes are moist.     Pharynx: No posterior oropharyngeal erythema.  Eyes:     Extraocular Movements: Extraocular movements intact.      Conjunctiva/sclera: Conjunctivae normal.     Pupils: Pupils are equal, round, and reactive to light.  Cardiovascular:     Rate and Rhythm: Normal rate and regular rhythm.     Pulses: Normal pulses.     Heart sounds: Normal heart sounds.  Pulmonary:     Effort: Pulmonary effort is normal. No respiratory distress.     Breath sounds: No stridor. Wheezing and rhonchi present. No rales.  Abdominal:     General: Abdomen is flat. Bowel sounds are normal.     Palpations: Abdomen is soft.  Musculoskeletal:        General: Normal range of motion.     Cervical back: Normal range of motion.  Skin:    General: Skin is warm and dry.  Neurological:     General: No focal deficit present.     Mental Status: She is alert and oriented to person, place, and time. Mental status is at baseline.  Psychiatric:        Mood and Affect: Mood normal.        Behavior: Behavior normal.     UC Treatments / Results  Labs (all labs ordered are listed, but only abnormal results are displayed) Labs Reviewed  BASIC METABOLIC PANEL  CBC    EKG   Radiology No results found.  Procedures Procedures (including critical care time)  Medications Ordered in UC Medications - No data to display  Initial Impression / Assessment and Plan / UC Course  I have reviewed the triage vital signs and the nursing notes.  Pertinent labs & imaging results that were available during my care of the patient were reviewed by me and considered in my medical decision making (see chart for details).     Patient was advised that she needs to go to the hospital for more extensive evaluation and treatment given tachycardia, increased blood pressure, low oxygen saturation, and adventitious lung sounds on exam.  Patient was adamant about not going to the hospital.  Risks associated with not going to hospital were discussed with patient.  Patient voiced understanding.  Do think the patient needs additional chest imaging but there is no  x-ray tech in urgent care today.  Patient was offered outpatient imaging but  declined.  Will opt to treat for possible pneumonia with Augmentin antibiotic, azithromycin antibiotic, prednisone steroid, albuterol inhaler.  BMP and CBC pending as well.  Discussed strict ER precautions.  Patient verbalized understanding and was agreeable with plan. Final Clinical Impressions(s) / UC Diagnoses   Final diagnoses:  Community acquired pneumonia, unspecified laterality     Discharge Instructions      You have been prescribed 2 more antibiotics as well as a prednisone steroid taper and albuterol inhaler.  Please go to the hospital if symptoms significantly worsen.  Blood work is pending.  We will call if it is positive.    ED Prescriptions     Medication Sig Dispense Auth. Provider   amoxicillin-clavulanate (AUGMENTIN) 875-125 MG tablet Take 1 tablet by mouth every 12 (twelve) hours. 14 tablet [place], [place] E, [place]   azithromycin (ZITHROMAX) 500 MG tablet Take 1 tablet (500 mg total) by mouth daily for 1 day, THEN 0.5 tablets (250 mg total) daily for 4 days. 3 tablet [place], [place] E, [place]   predniSONE (STERAPRED UNI-PAK 21 TAB) 10 MG (21) TBPK tablet Take by mouth daily. Take 6 tabs by mouth daily  for 2 days, then 5 tabs for 2 days, then 4 tabs for 2 days, then 3 tabs for 2 days, 2 tabs for 2 days, then 1 tab by mouth daily for 2 days 42 tablet Natara Monfort, Rolly Salter E, FNP   albuterol (VENTOLIN HFA) 108 (90 Base) MCG/ACT inhaler Inhale 1-2 puffs into the lungs every 6 (six) hours as needed for wheezing or shortness of breath. 1 each Gustavus Bryant, [place]      PDMP not reviewed this encounter.   Gustavus Bryant, [place] 12/15/21 1526

## 2021-12-15 NOTE — Discharge Instructions (Signed)
You have been prescribed 2 more antibiotics as well as a prednisone steroid taper and albuterol inhaler.  Please go to the hospital if symptoms significantly worsen.  Blood work is pending.  We will call if it is positive.

## 2021-12-16 LAB — CBC
Hematocrit: 39.1 % (ref 34.0–46.6)
Hemoglobin: 13 g/dL (ref 11.1–15.9)
MCH: 31.6 pg (ref 26.6–33.0)
MCHC: 33.2 g/dL (ref 31.5–35.7)
MCV: 95 fL (ref 79–97)
Platelets: 220 10*3/uL (ref 150–450)
RBC: 4.11 x10E6/uL (ref 3.77–5.28)
RDW: 12.3 % (ref 11.7–15.4)
WBC: 7.5 10*3/uL (ref 3.4–10.8)

## 2021-12-16 LAB — BASIC METABOLIC PANEL
BUN/Creatinine Ratio: 15 (ref 12–28)
BUN: 10 mg/dL (ref 8–27)
CO2: 25 mmol/L (ref 20–29)
Calcium: 9.9 mg/dL (ref 8.7–10.3)
Chloride: 101 mmol/L (ref 96–106)
Creatinine, Ser: 0.67 mg/dL (ref 0.57–1.00)
Glucose: 94 mg/dL (ref 70–99)
Potassium: 3.9 mmol/L (ref 3.5–5.2)
Sodium: 142 mmol/L (ref 134–144)
eGFR: 93 mL/min/{1.73_m2} (ref >59)

## 2022-05-17 IMAGING — DX DG CHEST 2V
2 series · 2 of 2 positions shown · non-contrast
Comparison: None

CLINICAL DATA: Cough for about a month per, progressively worse

EXAM:
CHEST - 2 VIEW

[chest pa]
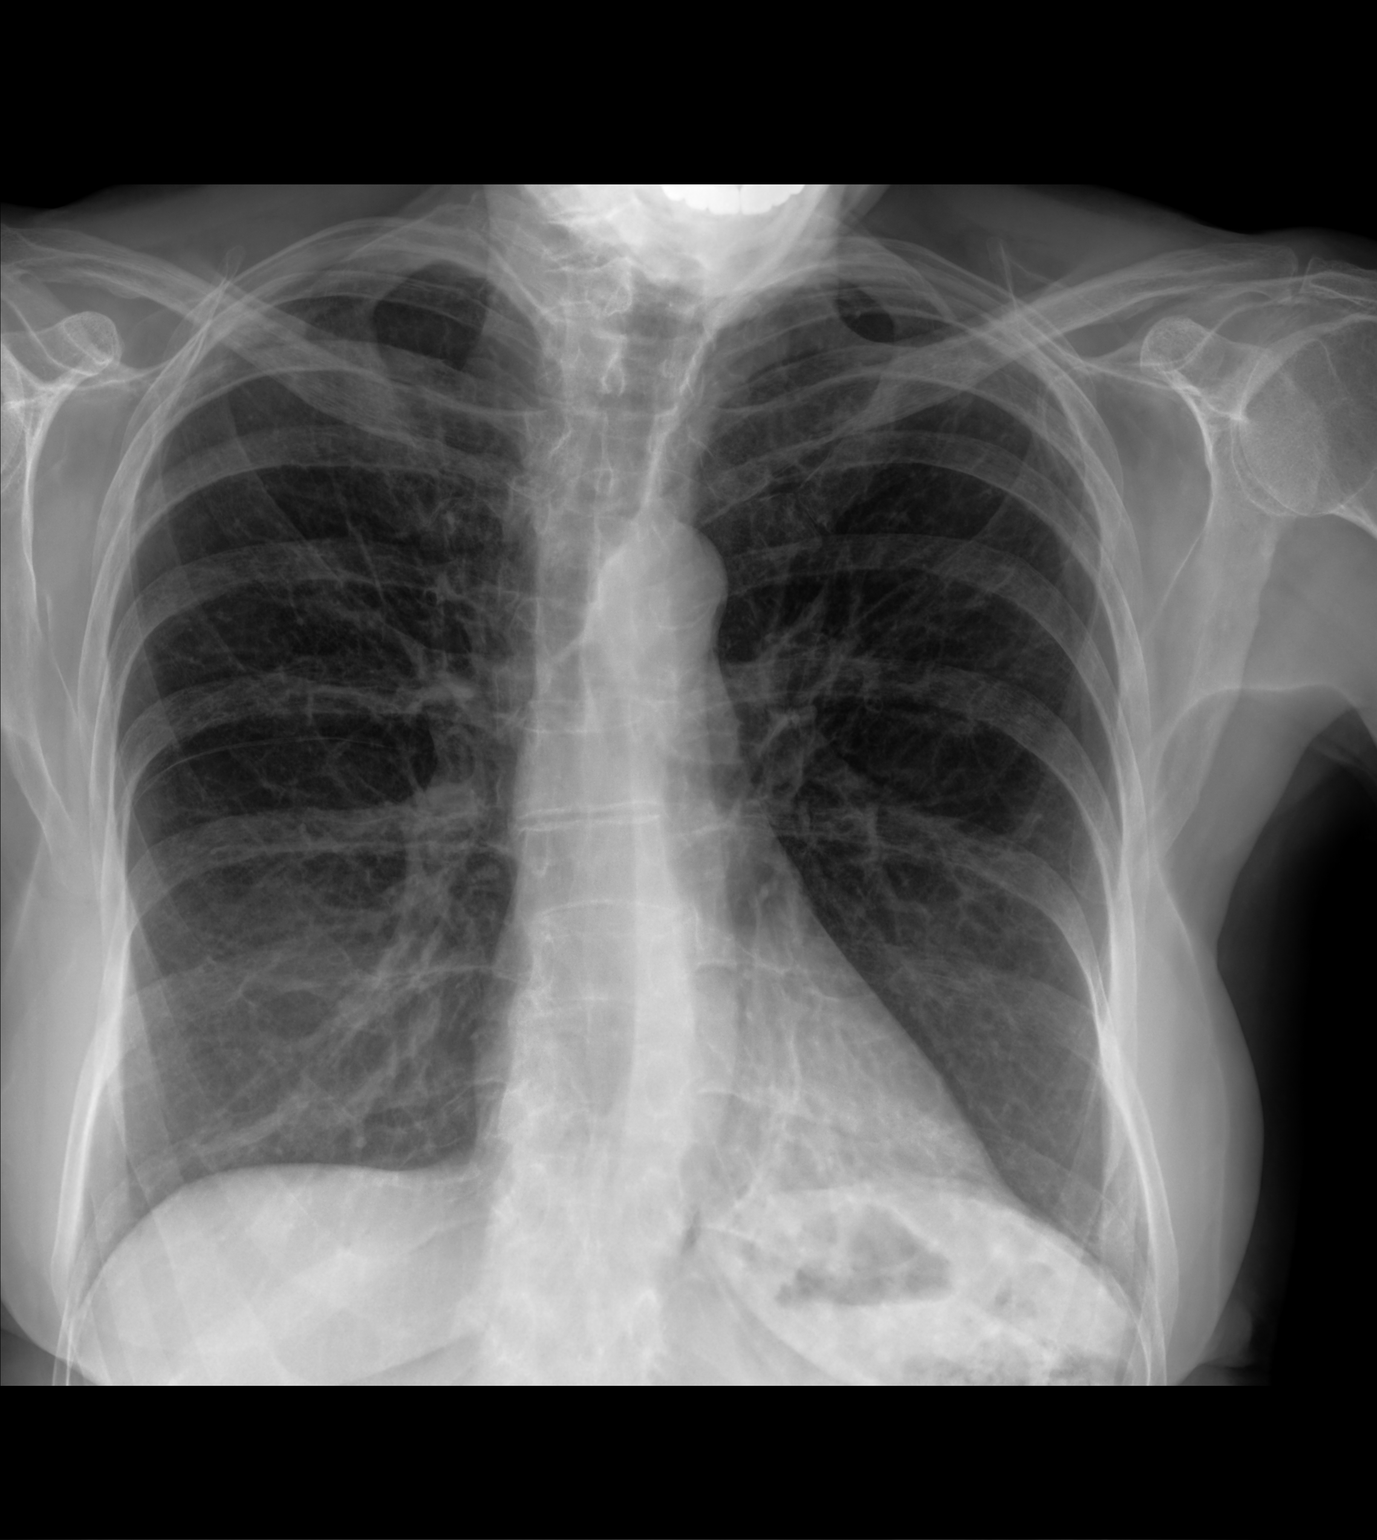

[chest lat]
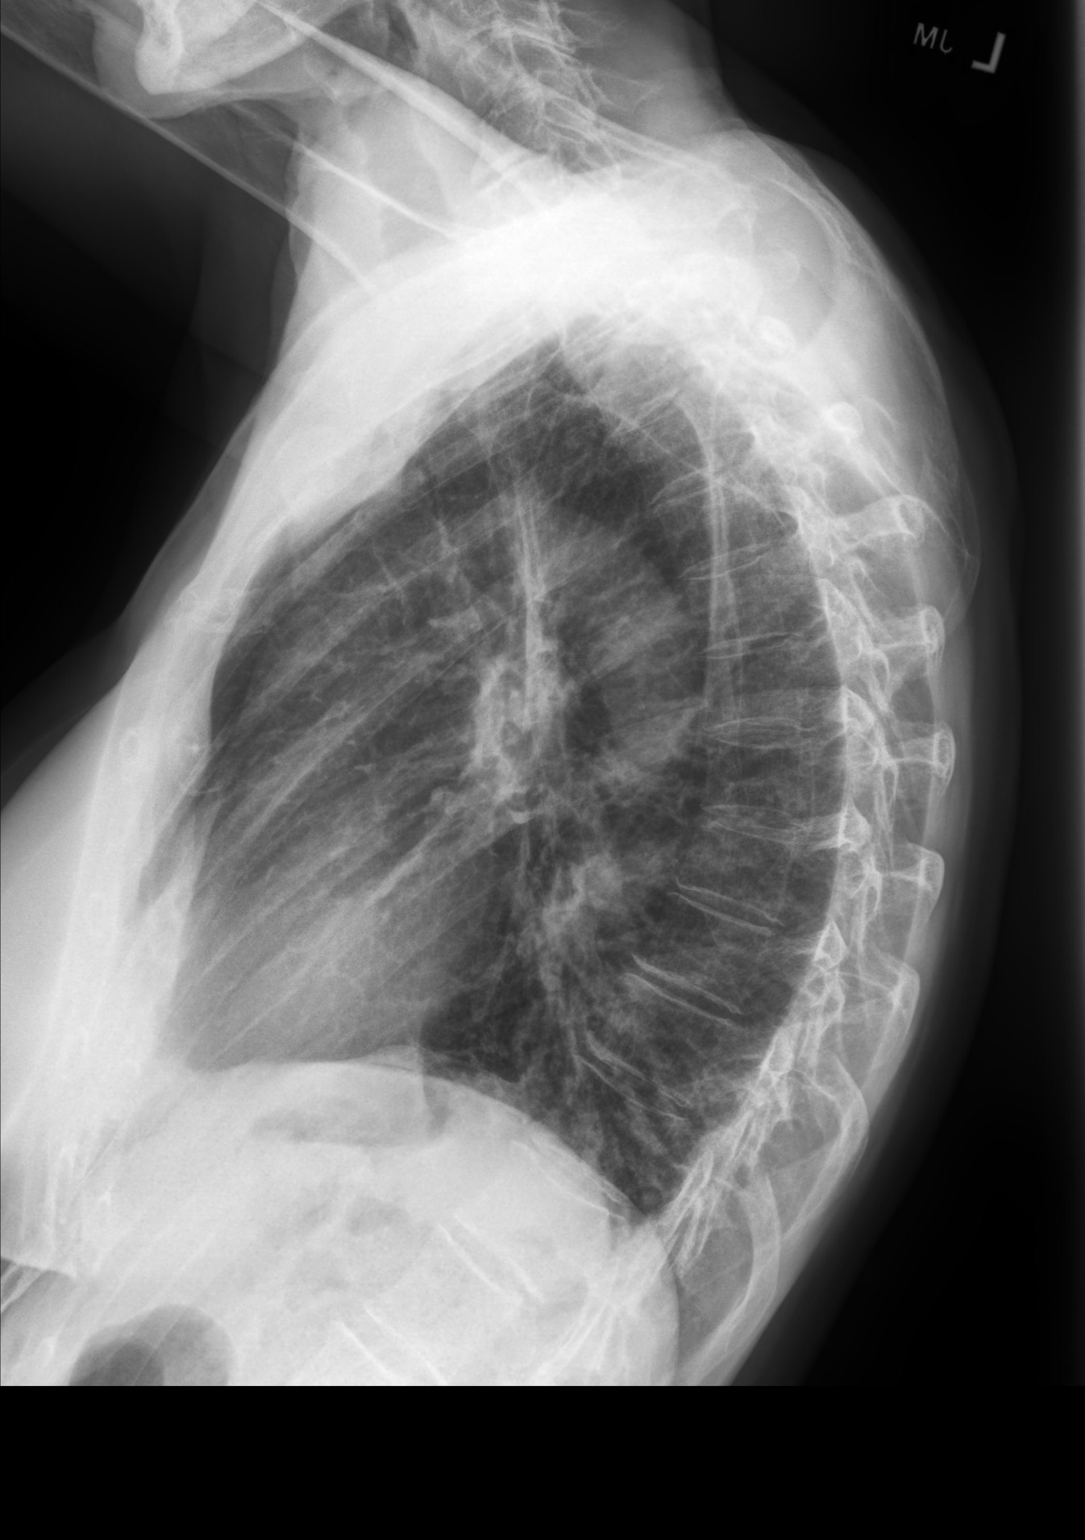

[2 of 2 positions shown; findings below may reference images not displayed]

FINDINGS: Normal heart size, mediastinal contours, and pulmonary vascularity.

LEFT lower lobe retrocardiac opacity question atelectasis versus
infiltrate.

Remaining lungs clear.

No pleural effusion or pneumothorax.

Bones demineralized.

Atherosclerotic calcification aorta.
IMPRESSION: LEFT lower lobe atelectasis versus infiltrate.

Aortic Atherosclerosis ([id number]-[id number].O).

## 2022-10-12 ENCOUNTER — Ambulatory Visit
Admission: EM | Admit: 2022-10-12 | Discharge: 2022-10-12 | Disposition: A | Discharge status: Home / Self Care | Payer: Managed Care, Other (non HMO) | Attending: Physician Assistant | Admitting: Physician Assistant

## 2022-10-12 DIAGNOSIS — J441 Chronic obstructive pulmonary disease with (acute) exacerbation: Secondary | ICD-10-CM

## 2022-10-12 MED ORDER — PREDNISONE 20 MG PO TABS: 40.0000 mg | ORAL_TABLET | Freq: Every day | ORAL | 0 refills | Status: AC

## 2022-10-12 MED ORDER — AZITHROMYCIN 250 MG PO TABS: 250.0000 mg | ORAL_TABLET | Freq: Every day | ORAL | 0 refills | Status: AC

## 2022-10-12 NOTE — ED Provider Notes (Signed)
[name]    CSN: [id number] Arrival date & time: 10/12/22  0843      History   Chief Complaint Chief Complaint  Patient presents with   Cough    see prior chart with diagnosis. - Entered by patient    HPI Stacey Chan is a 71 y.o. female.   Patient here today for evaluation of chronic cough that is worsened recently.  She reports over the last month she has had a more productive and persistent cough and she typically experiences with her COPD.  She states she does not typically use nebulizers at home but does have inhaler.  She has tried over-the-counter medication without significant improvement.  She has not had fever.    The history is provided by the patient.  Cough Associated symptoms: shortness of breath and wheezing   Associated symptoms: no chills, no ear pain, no eye discharge and no fever     History reviewed. No pertinent past medical history.  There are no problems to display for this patient.   History reviewed. No pertinent surgical history.  OB History   No obstetric history on file.      Home Medications    Prior to Admission medications   Medication Sig Start Date End Date Taking? Authorizing Provider  azithromycin (ZITHROMAX) 250 MG tablet Take 1 tablet (250 mg total) by mouth daily. Take first 2 tablets together, then 1 every day until finished. 10/12/22  Yes Tomi Bamberger, PA-C  predniSONE (DELTASONE) 20 MG tablet Take 2 tablets (40 mg total) by mouth daily with breakfast for 5 days. 10/12/22 10/17/22 Yes Tomi Bamberger, PA-C  albuterol (VENTOLIN HFA) 108 (90 Base) MCG/ACT inhaler Inhale 1-2 puffs into the lungs every 6 (six) hours as needed for wheezing or shortness of breath. 12/15/21   Gustavus Bryant, FNP  dextromethorphan-guaiFENesin [facility] DM) 30-600 MG 12hr tablet Take 1 tablet by mouth 2 (two) times daily. 03/16/21   Wieters, Hallie C, PA-C  naproxen (NAPROSYN) 500 MG tablet Take 1 tablet (500 mg total) by mouth 2 (two)  times daily. 06/19/19   Hall-Potvin, [place], PA-C    Family History Family History  Family history unknown: Yes    Social History Social History   Tobacco Use   Smoking status: Never   Smokeless tobacco: Never  Substance Use Topics   Alcohol use: No   Drug use: No     Allergies   Patient has no known allergies.   Review of Systems Review of Systems  Constitutional:  Negative for chills and fever.  HENT:  Positive for congestion. Negative for ear pain.   Eyes:  Negative for discharge and redness.  Respiratory:  Positive for cough, shortness of breath and wheezing.   Gastrointestinal:  Negative for abdominal pain, diarrhea, nausea and vomiting.     Physical Exam Triage Vital Signs ED Triage Vitals  Enc Vitals Group     BP      Pulse      Resp      Temp      Temp src      SpO2      Weight      Height      Head Circumference      Peak Flow      Pain Score      Pain Loc      Pain Edu?      Excl. in [place]?    No data found.  Updated Vital Signs BP Marland Kitchen)  165/80 (BP Location: Right Arm)   Pulse 90   Temp 97.7 F (36.5 C) (Oral)   Resp 17   SpO2 94%      Physical Exam Vitals and nursing note reviewed.  Constitutional:      General: She is not in acute distress.    Appearance: Normal appearance. She is not ill-appearing.  HENT:     Head: Normocephalic and atraumatic.     Nose: Congestion present.     Mouth/Throat:     Mouth: Mucous membranes are moist.     Pharynx: No oropharyngeal exudate or posterior oropharyngeal erythema.  Eyes:     Conjunctiva/sclera: Conjunctivae normal.  Cardiovascular:     Rate and Rhythm: Normal rate and regular rhythm.     Heart sounds: Normal heart sounds. No murmur heard. Pulmonary:     Effort: Pulmonary effort is normal. No respiratory distress.     Breath sounds: Wheezing (scattered) and rhonchi (scattered) present.  Skin:    General: Skin is warm and dry.  Neurological:     Mental Status: She is alert.   Psychiatric:        Mood and Affect: Mood normal.        Thought Content: Thought content normal.      UC Treatments / Results  Labs (all labs ordered are listed, but only abnormal results are displayed) Labs Reviewed - No data to display  EKG   Radiology No results found.  Procedures Procedures (including critical care time)  Medications Ordered in UC Medications - No data to display  Initial Impression / Assessment and Plan / UC Course  I have reviewed the triage vital signs and the nursing notes.  Pertinent labs & imaging results that were available during my care of the patient were reviewed by me and considered in my medical decision making (see chart for details).    Will treat to cover COPD exacerbation with prednisone burst and zpak. Encouraged follow up with any further concerns or persistent symptoms.   Final Clinical Impressions(s) / UC Diagnoses   Final diagnoses:  COPD exacerbation [facility])   Discharge Instructions   None    ED Prescriptions     Medication Sig Dispense Auth. Provider   predniSONE (DELTASONE) 20 MG tablet Take 2 tablets (40 mg total) by mouth daily with breakfast for 5 days. 10 tablet Erma Pinto F, PA-C   azithromycin (ZITHROMAX) 250 MG tablet Take 1 tablet (250 mg total) by mouth daily. Take first 2 tablets together, then 1 every day until finished. 6 tablet Tomi Bamberger, PA-C      PDMP not reviewed this encounter.   Tomi Bamberger, PA-C 10/12/22 1226

## 2022-10-12 NOTE — ED Triage Notes (Signed)
Pt presents with chronic cough that is more productive mucous and current then her normal COPD cough.

## 2023-02-02 ENCOUNTER — Ambulatory Visit
Admission: RE | Admit: 2023-02-02 | Discharge: 2023-02-02 | Disposition: A | Discharge status: Home / Self Care | Payer: Managed Care, Other (non HMO) | Source: Ambulatory Visit | Attending: Internal Medicine | Admitting: Internal Medicine

## 2023-02-02 VITALS — BP 145/83 | HR 74 | Temp 98.1°F | Resp 16

## 2023-02-02 DIAGNOSIS — J069 Acute upper respiratory infection, unspecified: Secondary | ICD-10-CM | POA: Diagnosis not present

## 2023-02-02 MED ORDER — PREDNISONE 20 MG PO TABS: 40.0000 mg | ORAL_TABLET | Freq: Every day | ORAL | 0 refills | Status: AC

## 2023-02-02 MED ORDER — BENZONATATE 100 MG PO CAPS: 100.0000 mg | ORAL_CAPSULE | Freq: Three times a day (TID) | ORAL | 0 refills | Status: DC | PRN

## 2023-02-02 NOTE — Discharge Instructions (Signed)
It appears that you have a viral illness.  I have prescribed prednisone and a cough medication to help alleviate symptoms.  Follow-up if any symptoms persist or worsen.

## 2023-02-02 NOTE — ED Triage Notes (Signed)
Pt presents with cough, congestion, and loss of voice Xs 5 days. States has tried OTC medications with no relief.

## 2023-02-02 NOTE — ED Provider Notes (Signed)
[name]    CSN: [id number] Arrival date & time: 02/02/23  1148      History   Chief Complaint Chief Complaint  Patient presents with   Cough   Sore Throat    HPI Stacey Chan is a 72 y.o. female.   Patient presents with 5-day history of cough, nasal congestion, voice hoarseness.  Reports cough is productive.  Denies any fever.  Patient does report that she had to travel on an airplane recently and there was several people coughing around her.  She has taken Robitussin with minimal improvement in symptoms.  Reports that she has been told that she has COPD.  Denies chest pain, shortness of breath, gastrointestinal symptoms.   Cough Sore Throat    History reviewed. No pertinent past medical history.  There are no problems to display for this patient.   History reviewed. No pertinent surgical history.  OB History   No obstetric history on file.      Home Medications    Prior to Admission medications   Medication Sig Start Date End Date Taking? Authorizing Provider  benzonatate (TESSALON) 100 MG capsule Take 1 capsule (100 mg total) by mouth every 8 (eight) hours as needed for cough. 02/02/23  Yes Sayward Horvath, Hildred Alamin E, FNP  predniSONE (DELTASONE) 20 MG tablet Take 2 tablets (40 mg total) by mouth daily for 5 days. 02/02/23 02/07/23 Yes Jordin Vicencio, Michele Rockers, FNP  albuterol (VENTOLIN HFA) 108 (90 Base) MCG/ACT inhaler Inhale 1-2 puffs into the lungs every 6 (six) hours as needed for wheezing or shortness of breath. 12/15/21   Teodora Medici, FNP  azithromycin (ZITHROMAX) 250 MG tablet Take 1 tablet (250 mg total) by mouth daily. Take first 2 tablets together, then 1 every day until finished. 10/12/22   Francene Finders, PA-C  dextromethorphan-guaiFENesin [facility] DM) 30-600 MG 12hr tablet Take 1 tablet by mouth 2 (two) times daily. 03/16/21   Wieters, Hallie C, PA-C  naproxen (NAPROSYN) 500 MG tablet Take 1 tablet (500 mg total) by mouth 2 (two) times daily. 06/19/19    Hall-Potvin, [place], PA-C    Family History Family History  Family history unknown: Yes    Social History Social History   Tobacco Use   Smoking status: Never   Smokeless tobacco: Never  Substance Use Topics   Alcohol use: No   Drug use: No     Allergies   Patient has no known allergies.   Review of Systems Review of Systems Per HPI  Physical Exam Triage Vital Signs ED Triage Vitals  Enc Vitals Group     BP 02/02/23 1159 (!) 145/83     Pulse Rate 02/02/23 1159 74     Resp 02/02/23 1159 16     Temp 02/02/23 1159 98.1 F (36.7 C)     Temp Source 02/02/23 1159 Oral     SpO2 02/02/23 1159 96 %     Weight --      Height --      Head Circumference --      Peak Flow --      Pain Score 02/02/23 1158 0     Pain Loc --      Pain Edu? --      Excl. in [place]? --    No data found.  Updated Vital Signs BP (!) 145/83 (BP Location: Left Arm)   Pulse 74   Temp 98.1 F (36.7 C) (Oral)   Resp 16   SpO2 96%   Visual  Acuity Right Eye Distance:   Left Eye Distance:   Bilateral Distance:    Right Eye Near:   Left Eye Near:    Bilateral Near:     Physical Exam Constitutional:      General: She is not in acute distress.    Appearance: Normal appearance. She is not toxic-appearing or diaphoretic.  HENT:     Head: Normocephalic and atraumatic.     Right Ear: Tympanic membrane and ear canal normal.     Left Ear: Tympanic membrane and ear canal normal.     Nose: Congestion present.     Mouth/Throat:     Mouth: Mucous membranes are moist.     Pharynx: No posterior oropharyngeal erythema.  Eyes:     Extraocular Movements: Extraocular movements intact.     Conjunctiva/sclera: Conjunctivae normal.     Pupils: Pupils are equal, round, and reactive to light.  Cardiovascular:     Rate and Rhythm: Normal rate and regular rhythm.     Pulses: Normal pulses.     Heart sounds: Normal heart sounds.  Pulmonary:     Effort: Pulmonary effort is normal. No respiratory  distress.     Breath sounds: Normal breath sounds. No stridor. No wheezing, rhonchi or rales.  Abdominal:     General: Abdomen is flat. Bowel sounds are normal.     Palpations: Abdomen is soft.  Musculoskeletal:        General: Normal range of motion.     Cervical back: Normal range of motion.  Skin:    General: Skin is warm and dry.  Neurological:     General: No focal deficit present.     Mental Status: She is alert and oriented to person, place, and time. Mental status is at baseline.  Psychiatric:        Mood and Affect: Mood normal.        Behavior: Behavior normal.      UC Treatments / Results  Labs (all labs ordered are listed, but only abnormal results are displayed) Labs Reviewed - No data to display  EKG   Radiology No results found.  Procedures Procedures (including critical care time)  Medications Ordered in UC Medications - No data to display  Initial Impression / Assessment and Plan / UC Course  I have reviewed the triage vital signs and the nursing notes.  Pertinent labs & imaging results that were available during my care of the patient were reviewed by me and considered in my medical decision making (see chart for details).     Patient presents with symptoms likely from a viral upper respiratory infection. Do not suspect underlying cardiopulmonary process. Symptoms seem unlikely related to ACS, CHF or COPD exacerbations, pneumonia, pneumothorax. Patient is nontoxic appearing and not in need of emergent medical intervention.  Patient adamantly declined COVID testing.  There are no adventitious lung sounds on exam so do not think that chest imaging is necessary.  Recommended symptom control with medications and supportive care.  Given symptoms have been refractory to over-the-counter medications and patient's history of COPD, will treat with steroid therapy as patient has taken this before and tolerated well.  Cough medication also prescribed for  patient.  Return if symptoms fail to improve in 1-2 weeks or you develop shortness of breath, chest pain, severe headache. Patient states understanding and is agreeable.  Discharged with PCP followup.  Final Clinical Impressions(s) / UC Diagnoses   Final diagnoses:  Viral upper respiratory tract infection with cough  Discharge Instructions      It appears that you have a viral illness.  I have prescribed prednisone and a cough medication to help alleviate symptoms.  Follow-up if any symptoms persist or worsen.    ED Prescriptions     Medication Sig Dispense Auth. Provider   predniSONE (DELTASONE) 20 MG tablet Take 2 tablets (40 mg total) by mouth daily for 5 days. 10 tablet [place], [place] E, [place]   benzonatate (TESSALON) 100 MG capsule Take 1 capsule (100 mg total) by mouth every 8 (eight) hours as needed for cough. 21 capsule [place], Michele Rockers, [place]      PDMP not reviewed this encounter.   Teodora Medici, [place] 02/02/23 [phone]

## 2023-05-17 ENCOUNTER — Ambulatory Visit
Admission: EM | Admit: 2023-05-17 | Discharge: 2023-05-17 | Disposition: A | Discharge status: Home / Self Care | Payer: Managed Care, Other (non HMO) | Attending: Internal Medicine | Admitting: Internal Medicine

## 2023-05-17 DIAGNOSIS — R053 Chronic cough: Secondary | ICD-10-CM

## 2023-05-17 DIAGNOSIS — J441 Chronic obstructive pulmonary disease with (acute) exacerbation: Secondary | ICD-10-CM | POA: Diagnosis not present

## 2023-05-17 MED ORDER — BENZONATATE 100 MG PO CAPS: 100.0000 mg | ORAL_CAPSULE | Freq: Three times a day (TID) | ORAL | 0 refills | Status: AC | PRN

## 2023-05-17 MED ORDER — PREDNISONE 10 MG (21) PO TBPK: ORAL_TABLET | Freq: Every day | ORAL | 0 refills | Status: AC

## 2023-05-17 MED ORDER — AMOXICILLIN-POT CLAVULANATE 875-125 MG PO TABS: 1.0000 | ORAL_TABLET | Freq: Two times a day (BID) | ORAL | 0 refills | Status: AC

## 2023-05-17 MED ORDER — ALBUTEROL SULFATE (2.5 MG/3ML) 0.083% IN NEBU
2.5000 mg | INHALATION_SOLUTION | Freq: Once | RESPIRATORY_TRACT | Status: AC
  Administered 2023-05-17: 2.5 mg via RESPIRATORY_TRACT

## 2023-05-17 NOTE — Discharge Instructions (Signed)
I have prescribed you an antibiotic, steroid, cough medication for your current symptoms.  Pulmonology referral has been placed for you.  They should reach out you in the next 48 to 72 hours, but if they do not, please call them yourself at provided contact information.  Go to the ER if any symptoms persist or worsen.

## 2023-05-17 NOTE — ED Triage Notes (Addendum)
Pt c/o cough, chest congestion, hoarseness, SOB  x 2 weeks. She states she was giving her sister a ride to church and she had a cough, then she picked it up.

## 2023-05-17 NOTE — ED Provider Notes (Signed)
[name]    CSN: [id number] Arrival date & time: 05/17/23  1121      History   Chief Complaint No chief complaint on file.   HPI Stacey Chan is a 72 y.o. female.   Patient presents with 2-week history of productive cough and feelings of chest congestion.  Reports cough is productive with green sputum.  Denies any upper respiratory symptoms, fever, body aches, chills.  Patient reports intermittent shortness of breath.  She uses Ventolin as needed for shortness of breath.  Reports that she has COPD.  Last COPD exacerbation was in April where she took an antibiotic and a steroid.  States that her family has had similar cough.     History reviewed. No pertinent past medical history.  There are no problems to display for this patient.   History reviewed. No pertinent surgical history.  OB History   No obstetric history on file.      Home Medications    Prior to Admission medications   Medication Sig Start Date End Date Taking? Authorizing Provider  amoxicillin-clavulanate (AUGMENTIN) 875-125 MG tablet Take 1 tablet by mouth every 12 (twelve) hours. 05/17/23  Yes Roberts Bon, Rolly Salter E, FNP  benzonatate (TESSALON) 100 MG capsule Take 1 capsule (100 mg total) by mouth every 8 (eight) hours as needed for cough. 05/17/23  Yes Joelle Roswell, Rolly Salter E, FNP  predniSONE (STERAPRED UNI-PAK 21 TAB) 10 MG (21) TBPK tablet Take by mouth daily. Take 6 tabs by mouth daily  for 2 days, then 5 tabs for 2 days, then 4 tabs for 2 days, then 3 tabs for 2 days, 2 tabs for 2 days, then 1 tab by mouth daily for 2 days 05/17/23  Yes Suriyah Vergara, Acie Fredrickson, FNP  albuterol (VENTOLIN HFA) 108 (90 Base) MCG/ACT inhaler Inhale 1-2 puffs into the lungs every 6 (six) hours as needed for wheezing or shortness of breath. 12/15/21   Gustavus Bryant, FNP  azithromycin (ZITHROMAX) 250 MG tablet Take 1 tablet (250 mg total) by mouth daily. Take first 2 tablets together, then 1 every day until finished. 10/12/22   Tomi Bamberger, PA-C  dextromethorphan-guaiFENesin [facility] DM) 30-600 MG 12hr tablet Take 1 tablet by mouth 2 (two) times daily. 03/16/21   Wieters, Hallie C, PA-C  naproxen (NAPROSYN) 500 MG tablet Take 1 tablet (500 mg total) by mouth 2 (two) times daily. 06/19/19   Hall-Potvin, [place], PA-C    Family History Family History  Family history unknown: Yes    Social History Social History   Tobacco Use   Smoking status: Never   Smokeless tobacco: Never  Substance Use Topics   Alcohol use: No   Drug use: No     Allergies   Patient has no known allergies.   Review of Systems Review of Systems Per HPI  Physical Exam Triage Vital Signs ED Triage Vitals  Enc Vitals Group     BP 05/17/23 1142 (!) 151/70     Pulse Rate 05/17/23 1142 65     Resp 05/17/23 1142 13     Temp 05/17/23 1142 98.1 F (36.7 C)     Temp Source 05/17/23 1142 Oral     SpO2 05/17/23 1142 91 %     Weight --      Height --      Head Circumference --      Peak Flow --      Pain Score 05/17/23 1146 0     Pain Loc --  Pain Edu? --      Excl. in GC? --    No data found.  Updated Vital Signs BP (!) 151/70 (BP Location: Left Arm)   Pulse 65   Temp 98.1 F (36.7 C) (Oral)   Resp 13   SpO2 96%   Visual Acuity Right Eye Distance:   Left Eye Distance:   Bilateral Distance:    Right Eye Near:   Left Eye Near:    Bilateral Near:     Physical Exam Constitutional:      General: She is not in acute distress.    Appearance: Normal appearance. She is not toxic-appearing or diaphoretic.  HENT:     Head: Normocephalic and atraumatic.     Right Ear: Tympanic membrane and ear canal normal.     Left Ear: Tympanic membrane and ear canal normal.     Nose: No congestion.     Mouth/Throat:     Mouth: Mucous membranes are moist.     Pharynx: No posterior oropharyngeal erythema.  Eyes:     Extraocular Movements: Extraocular movements intact.     Conjunctiva/sclera: Conjunctivae normal.     Pupils:  Pupils are equal, round, and reactive to light.  Cardiovascular:     Rate and Rhythm: Normal rate and regular rhythm.     Pulses: Normal pulses.     Heart sounds: Normal heart sounds.  Pulmonary:     Effort: Pulmonary effort is normal. No respiratory distress.     Breath sounds: No stridor. Wheezing and rhonchi present. No rales.  Abdominal:     General: Abdomen is flat. Bowel sounds are normal.     Palpations: Abdomen is soft.  Musculoskeletal:        General: Normal range of motion.     Cervical back: Normal range of motion.  Skin:    General: Skin is warm and dry.  Neurological:     General: No focal deficit present.     Mental Status: She is alert and oriented to person, place, and time. Mental status is at baseline.  Psychiatric:        Mood and Affect: Mood normal.        Behavior: Behavior normal.      UC Treatments / Results  Labs (all labs ordered are listed, but only abnormal results are displayed) Labs Reviewed - No data to display  EKG   Radiology No results found.  Procedures Procedures (including critical care time)  Medications Ordered in UC Medications  albuterol (PROVENTIL) (2.5 MG/3ML) 0.083% nebulizer solution 2.5 mg (2.5 mg Nebulization Given 05/17/23 1206)    Initial Impression / Assessment and Plan / UC Course  I have reviewed the triage vital signs and the nursing notes.  Pertinent labs & imaging results that were available during my care of the patient were reviewed by me and considered in my medical decision making (see chart for details).     Patient exhibiting signs of COPD exacerbation.  Suggested chest x-ray given adventitious lung sounds on exam but patient was adamant that she did not want chest x-ray.  Will treat with Augmentin given recent antibiotic therapy in April and steroid taper.  Benzonatate prescribed to take as needed for cough as well.  Oxygen was maintaining at 96% during physical exam so do not think that emergent  evaluation is necessary.  Although, patient was given strict ER precautions.  Discussed with patient doing maintenance steroid inhaler such as Symbicort daily given recent frequent COPD exacerbations but  patient declined this at this time.  She was agreeable to pulmonary referral so this was placed for patient.  Advised patient that if they do not call her within the next 48 to 72 hours, she is to call them herself to schedule the appointment.  Patient verbalized understanding and was agreeable with plan. Final Clinical Impressions(s) / UC Diagnoses   Final diagnoses:  COPD exacerbation (HCC)  Persistent cough     Discharge Instructions      I have prescribed you an antibiotic, steroid, cough medication for your current symptoms.  Pulmonology referral has been placed for you.  They should reach out you in the next 48 to 72 hours, but if they do not, please call them yourself at provided contact information.  Go to the ER if any symptoms persist or worsen.     ED Prescriptions     Medication Sig Dispense Auth. Provider   amoxicillin-clavulanate (AUGMENTIN) 875-125 MG tablet Take 1 tablet by mouth every 12 (twelve) hours. 14 tablet [place], [place] E, [place]   predniSONE (STERAPRED UNI-PAK 21 TAB) 10 MG (21) TBPK tablet Take by mouth daily. Take 6 tabs by mouth daily  for 2 days, then 5 tabs for 2 days, then 4 tabs for 2 days, then 3 tabs for 2 days, 2 tabs for 2 days, then 1 tab by mouth daily for 2 days 42 tablet Yoko Mcgahee, [place] E, [place]   benzonatate (TESSALON) 100 MG capsule Take 1 capsule (100 mg total) by mouth every 8 (eight) hours as needed for cough. 21 capsule [place], Acie Fredrickson, [place]      PDMP not reviewed this encounter.   Gustavus Bryant, [place] 05/17/23 1215
